# Patient Record
Sex: Male | Born: 2000 | Race: White | Hispanic: No | Marital: Single | Smoking: Never smoker
Health system: Southern US, Community
[De-identification: ages and names within clinical notes are randomized; demographics above are authoritative.]

## PROBLEM LIST (undated history)

## (undated) DIAGNOSIS — L309 Dermatitis, unspecified: Secondary | ICD-10-CM

## (undated) DIAGNOSIS — J45909 Unspecified asthma, uncomplicated: Secondary | ICD-10-CM

## (undated) HISTORY — DX: Dermatitis, unspecified: L30.9

## (undated) HISTORY — PX: NO PAST SURGERIES: SHX2092

## (undated) HISTORY — DX: Unspecified asthma, uncomplicated: J45.909

---
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
## 2022-04-06 ENCOUNTER — Encounter: Payer: Self-pay | Admitting: Allergy & Immunology

## 2022-04-06 ENCOUNTER — Ambulatory Visit (INDEPENDENT_AMBULATORY_CARE_PROVIDER_SITE_OTHER): Payer: Commercial Managed Care - PPO | Admitting: Allergy & Immunology

## 2022-04-06 VITALS — BP 104/68 | HR 76 | Temp 98.6°F | Resp 16 | Ht 72.0 in | Wt 144.0 lb

## 2022-04-06 DIAGNOSIS — T781XXA Other adverse food reactions, not elsewhere classified, initial encounter: Secondary | ICD-10-CM

## 2022-04-06 DIAGNOSIS — J4520 Mild intermittent asthma, uncomplicated: Secondary | ICD-10-CM

## 2022-04-06 DIAGNOSIS — J3089 Other allergic rhinitis: Secondary | ICD-10-CM

## 2022-04-06 DIAGNOSIS — L500 Allergic urticaria: Secondary | ICD-10-CM | POA: Diagnosis not present

## 2022-04-06 DIAGNOSIS — J302 Other seasonal allergic rhinitis: Secondary | ICD-10-CM

## 2022-04-06 DIAGNOSIS — T781XXD Other adverse food reactions, not elsewhere classified, subsequent encounter: Secondary | ICD-10-CM

## 2022-04-06 MED ORDER — EPINEPHRINE 0.3 MG/0.3ML IJ SOAJ: 0.3000 mg | Freq: Once | INTRAMUSCULAR | 2 refills | Status: AC

## 2022-04-06 NOTE — Patient Instructions (Addendum)
1. Mild intermittent asthma, uncomplicated - Lung testing looks great. - We are going to remove asthma from your diagnosis list since this is not a problem any longer.   2.  Perennial and seasonal allergic rhinitis  - Testing today showed: grasses, ragweed, weeds, trees, and dog. - Copy of test results provided.  - Avoidance measures provided. - Continue with: Alavert as needed - You can use an extra dose of the antihistamine, if needed, for breakthrough symptoms.  - Consider nasal saline rinses 1-2 times daily to remove allergens from the nasal cavities as well as help with mucous clearance (this is especially helpful to do before the nasal sprays are given) - Based on your symptoms today, I do not think we need to be more aggressive with regards to treatment. - We may consider in the future if needed. - I think we can hold off on allergy shots for now.  3. Adverse food reaction - Testing was having reacted to peanuts and slightly reactive to almond and hazelnut. - Copy of testing results provided. - I did order blood work to clarify which part of the peanut protein you are sensitized to, which would help [place] determine whether it safe to do a food challenge. - You can get these labs done if you are interested in doing that. - In the meantime, continue to avoid peanuts and tree nuts.  4. Return in about 1 year (around 04/07/2023).    Please inform us of any Emergency Department visits, hospitalizations, or changes in symptoms. Call us before going to the ED for breathing or allergy symptoms since we might be able to fit you in for a sick visit. Feel free to contact us anytime with any questions, problems, or concerns.  It was a pleasure to see you again today!  Websites that have reliable patient information: 1. American Academy of Asthma, Allergy, and Immunology: www.aaaai.org 2. Food Allergy Research and Education (FARE): foodallergy.org 3. Mothers of Asthmatics:  http://www.asthmacommunitynetwork.org 4. American College of Allergy, Asthma, and Immunology: www.acaai.org   COVID-19 Vaccine Information can be found at: [email or web address] For questions related to vaccine distribution or appointments, please email [email or web address] or call [phone].   We realize that you might be concerned about having an allergic reaction to the COVID19 vaccines. To help with that concern, WE ARE OFFERING THE COVID19 VACCINES IN OUR OFFICE! Ask the front desk for dates!     "Like" [place] on Facebook and Instagram for our latest updates!      A healthy democracy works best when [employer] participate! Make sure you are registered to vote! If you have moved or changed any of your contact information, you will need to get this updated before voting!  In some cases, you MAY be able to register to vote online: [email or web address]      Airborne Adult Perc - 04/06/22 0936     Time Antigen Placed 6213    Allergen Manufacturer Waynette Buttery    Location Back    Number of Test 59    1. Control-Buffer 50% Glycerol Negative    2. Control-Histamine 1 mg/ml 2+    3. Albumin saline Negative    4. Bahia 3+    5. [place] 4+    6. Johnson 3+    7. Kentucky Blue Negative    8. Meadow Fescue 4+    9. Perennial Rye 4+    10. Sweet Vernal 4+    11. Timothy 4+    12. Cocklebur Negative  13. Burweed Marshelder Negative    14. Ragweed, short Negative    15. Ragweed, Giant 2+    16. Plantain,  English 2+    17. Lamb's Quarters 2+    18. Sheep Sorrell 2+    19. Rough Pigweed 2+    20. Marsh Elder, Rough Negative    21. Mugwort, Common 4+    22. Ash mix Negative    23. Birch mix 3+    24. Beech American Negative    25. Box, Elder 3+    26. Cedar, red 2+    27. Cottonwood, Eastern 2+    28. Elm mix 2+    29. Hickory 3+    30. Maple mix Negative    31. Oak, Guinea-Bissau mix Negative     32. Pecan Pollen Negative    33. Pine mix Negative    34. Sycamore Eastern Negative    35. Walnut, Black Pollen Negative    36. Alternaria alternata Negative    37. Cladosporium Herbarum Negative    38. Aspergillus mix Negative    39. Penicillium mix Negative    40. Bipolaris sorokiniana (Helminthosporium) Negative    41. Drechslera spicifera (Curvularia) Negative    42. Mucor plumbeus Negative    43. Fusarium moniliforme Negative    44. Aureobasidium pullulans (pullulara) Negative    45. Rhizopus oryzae Negative    46. Botrytis cinera Negative    47. Epicoccum nigrum Negative    48. Phoma betae Negative    49. Candida Albicans Negative    50. Trichophyton mentagrophytes Negative    51. Mite, D Farinae  5,000 AU/ml Negative    52. Mite, D Pteronyssinus  5,000 AU/ml Negative    53. Cat Hair 10,000 BAU/ml Negative    54.  Dog Epithelia 2+    55. Mixed Feathers Negative    56. Horse Epithelia Negative    57. Cockroach, German Negative    58. Mouse Negative    59. Tobacco Leaf Negative             Food Adult Perc - 04/06/22 0900     Time Antigen Placed 9563    Allergen Manufacturer Waynette Buttery    Location Back    Number of allergen test 8    1. Peanut --   29x45   10. Cashew Negative    11. Pecan Food Negative    12. Walnut Food Negative    13. Almond --   5x14   14. Hazelnut --   5x29   15. [place] nut Negative    16. Coconut Negative    17. Pistachio Negative             Reducing Pollen Exposure  The American Academy of Allergy, Asthma and Immunology suggests the following steps to reduce your exposure to pollen during allergy seasons.    Do not hang sheets or clothing out to dry; pollen may collect on these items. Do not mow lawns or spend time around freshly cut grass; mowing stirs up pollen. Keep windows closed at night.  Keep car windows closed while driving. Minimize morning activities outdoors, a time when pollen counts are usually at their highest. Stay  indoors as much as possible when pollen counts or humidity is high and on windy days when pollen tends to remain in the air longer. Use air conditioning when possible.  Many air conditioners have filters that trap the pollen spores. Use a HEPA room air filter to remove pollen form  the indoor air you breathe.   Control of Dog or Cat Allergen  Avoidance is the best way to manage a dog or cat allergy. If you have a dog or cat and are allergic to dog or cats, consider removing the dog or cat from the home. If you have a dog or cat but don't want to find it a new home, or if your family wants a pet even though someone in the household is allergic, here are some strategies that may help keep symptoms at bay:  Keep the pet out of your bedroom and restrict it to only a few rooms. Be advised that keeping the dog or cat in only one room will not limit the allergens to that room. Don't pet, hug or kiss the dog or cat; if you do, wash your hands with soap and water. High-efficiency particulate air (HEPA) cleaners run continuously in a bedroom or living room can reduce allergen levels over time. Regular use of a high-efficiency vacuum cleaner or a central vacuum can reduce allergen levels. Giving your dog or cat a bath at least once a week can reduce airborne allergen.

## 2022-04-06 NOTE — Addendum Note (Signed)
Addended by: Alfonse Spruce on: 04/06/2022 10:02 AM   Modules accepted: Orders

## 2022-04-06 NOTE — Progress Notes (Signed)
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 Mild intermittent asthma, uncomplicated - Lung testing looks great. - We are going to remove asthma from your diagnosis list since this is not a problem any longer.   2.  Perennial and seasonal allergic rhinitis  - Testing today showed: grasses, ragweed, weeds, trees, and dog. - Copy of test results provided.  - Avoidance measures provided. - Continue with: Alavert as needed - You can use an extra dose of the antihistamine, if needed, for breakthrough symptoms.  - Consider nasal saline rinses 1-2 times daily to remove allergens from the nasal cavities as well as help with mucous clearance (this is especially helpful to do before the nasal sprays are given) - Based on your symptoms today, I do not think we need to be more aggressive with regards to treatment. - We may consider in the future if needed. - I think we can hold off on allergy shots for now.  3. Adverse food reaction - Testing was having reacted to peanuts and slightly reactive to almond and hazelnut. - Copy of testing results provided. - I did order blood work to clarify which part of the peanut protein you are sensitized to, which would help [place] determine whether it safe to do a food challenge. - You can get these labs done if you are interested in doing that. - In the meantime, continue to avoid peanuts and tree nuts.  4. Return in about 1 year (around 04/07/2023).   This note in its entirety was forwarded to the Provider who requested this consultation.  Subjective:   Kyle Harper is a 21 y.o. male presenting today for evaluation of  Chief Complaint  Patient presents  with   Asthma   Allergy Testing    Environmental: All except cat/dog Food: Peanuts, Tree Nuts    Kyle Harper has a history of the following: Patient Active Problem List   Diagnosis Date Noted   Mild intermittent asthma, uncomplicated 07/21/2016   Chronic seasonal allergic rhinitis due to pollen 07/21/2016   Adverse food reaction 07/21/2016    History obtained from: chart review and patient.  Kyle Harper was referred by Joya Salm.     Kyle Harper is a 21 y.o. male presenting for an evaluation of allergies and food allergies .  He was last seen in December 2017.  At that time, his lung testing looked good.  We continue with albuterol as needed.  For his allergic rhinitis, we continue with Allegra during the spring season.  We refilled his epinephrine autoinjector.  We recommended retesting at some point.  He has not followed up since that time.   Asthma/Respiratory Symptom History: He has intermittent asthma that is worse in the winter months.  He does not remember the last time that he used albuterol at all.   Allergic Rhinitis Symptom History: His last environmental allergy testing was in July 2014 and was positive to grasses, weeds, trees, and seleceted molds as well as dust mite and cat. He has dust mite covers on his pillows and bedding. He is on Alavert that he takes in the spring time and whatnot. He does not get sinus infections often at all. He does not get antibiotics regularly at  all. He does not remember the last time he got antibiotics.   Food Allergy Symptom History: He had testing performed in August 2014 by Dr. Mikle Bosworth that showed positives to peanuts (10x10), cashew (3x3), pecan (2x2), walnut (3x3), almond (3x3), and hazelnut (3x3).  He has not had any other accidental exposures. He had otherwise not eaten peanuts or tree nuts. He does need a new EpiPen. He does eat may contain products.    He has acne and is followed by Dr. Orvan Falconer. He takes doxycycline daily.  He has been on that daily for a number of years around three or so. He also takes a probiotic with that.   He has a degree in Biology. He is hoping to work with animals.  Otherwise, there is no history of other atopic diseases, including drug allergies, stinging insect allergies, eczema, urticaria, or contact dermatitis. There is no significant infectious history. Vaccinations are up to date.    Past Medical History: Patient Active Problem List   Diagnosis Date Noted   Mild intermittent asthma, uncomplicated 07/21/2016   Chronic seasonal allergic rhinitis due to pollen 07/21/2016   Adverse food reaction 07/21/2016    Medication List:  Allergies as of 04/06/2022       Reactions   Other Anaphylaxis   Tree Nut   Peanut-containing Drug Products Anaphylaxis        Medication List        Accurate as of April 06, 2022  9:55 AM. If you have any questions, ask your nurse or doctor.          STOP taking these medications    acidophilus Caps capsule Stopped by: Kyle Spruce, MD       TAKE these medications    ascorbic acid 500 MG tablet Commonly known as: VITAMIN C Take 500 mg by mouth daily.   cholecalciferol 1000 units tablet Commonly known as: VITAMIN D Take 1,000 Units by mouth daily.   doxycycline 100 MG capsule Commonly known as: VIBRAMYCIN Take 100 mg by mouth 2 (two) times daily.   Probiotic 250 MG Caps Take by mouth.   Ventolin HFA 108 (90 Base) MCG/ACT inhaler Generic drug: albuterol Inhale 2 puffs into the lungs every 6 (six) hours as needed for wheezing or shortness of breath.        Birth History: non-contributory  Developmental History: non-contributory  Past Surgical History: Past Surgical History:  Procedure Laterality Date   NO PAST SURGERIES       Family History: Family History  Problem Relation Age of Onset   Allergic rhinitis Neg Hx    Angioedema Neg Hx    Asthma Neg Hx    Eczema Neg Hx    Immunodeficiency Neg  Hx    Urticaria Neg Hx      Social History: Kyle Harper lives at home with his family in [place]. He had a pet rabbit who passed away a few months ago. They live in a house that is 23 years old. There is hardwood flooring throughout the home. There is a heat pump for heating and cooling. There are no indoor animals at this point in time. There are not dust mite coverings on the bed or pillows.     Review of Systems  Constitutional: Negative.  Negative for chills, fever, malaise/fatigue and weight loss.  HENT:  Negative for congestion, ear discharge, ear pain and sinus pain.   Eyes:  Negative for pain, discharge and redness.  Respiratory:  Negative for cough, sputum production,  shortness of breath and wheezing.   Cardiovascular: Negative.  Negative for chest pain and palpitations.  Gastrointestinal:  Negative for abdominal pain, constipation, diarrhea, heartburn, nausea and vomiting.  Skin: Negative.  Negative for itching and rash.  Neurological:  Negative for dizziness and headaches.  Endo/Heme/Allergies:  Negative for environmental allergies. Does not bruise/bleed easily.       Objective:   Blood pressure 104/68, pulse 76, temperature 98.6 F (37 C), resp. rate 16, height 6' (1.829 m), weight 144 lb (65.3 kg), SpO2 97 %. Body mass index is 19.53 kg/m.     Physical Exam Vitals reviewed.  Constitutional:      Appearance: He is well-developed.     Comments: Somewhat quiet.  Cooperative overall.  HENT:     Head: Normocephalic and atraumatic.     Right Ear: Tympanic membrane, ear canal and external ear normal. No drainage, swelling or tenderness. Tympanic membrane is not injected, scarred, erythematous, retracted or bulging.     Left Ear: Tympanic membrane, ear canal and external ear normal. No drainage, swelling or tenderness. Tympanic membrane is not injected, scarred, erythematous, retracted or bulging.     Nose: No nasal deformity, septal deviation, mucosal edema or rhinorrhea.      Right Turbinates: Enlarged, swollen and pale.     Left Turbinates: Enlarged, swollen and pale.     Right Sinus: No maxillary sinus tenderness or frontal sinus tenderness.     Left Sinus: No maxillary sinus tenderness or frontal sinus tenderness.     Mouth/Throat:     Mouth: Mucous membranes are not pale and not dry.     Pharynx: Uvula midline.  Eyes:     General:        Right eye: No discharge.        Left eye: No discharge.     Conjunctiva/sclera: Conjunctivae normal.     Right eye: Right conjunctiva is not injected. No chemosis.    Left eye: Left conjunctiva is not injected. No chemosis.    Pupils: Pupils are equal, round, and reactive to light.  Cardiovascular:     Rate and Rhythm: Normal rate and regular rhythm.     Heart sounds: Normal heart sounds.  Pulmonary:     Effort: Pulmonary effort is normal. No tachypnea, accessory muscle usage or respiratory distress.     Breath sounds: Normal breath sounds. No wheezing, rhonchi or rales.     Comments: Moving air well in all lung fields. No increased work of breathing noted.   Chest:     Chest wall: No tenderness.  Abdominal:     Tenderness: There is no abdominal tenderness. There is no guarding or rebound.  Lymphadenopathy:     Head:     Right side of head: No submandibular, tonsillar or occipital adenopathy.     Left side of head: No submandibular, tonsillar or occipital adenopathy.     Cervical: No cervical adenopathy.  Skin:    Coloration: Skin is not pale.     Findings: No abrasion, erythema, petechiae or rash. Rash is not papular, urticarial or vesicular.  Neurological:     Mental Status: He is alert.  Psychiatric:        Behavior: Behavior is cooperative.      Diagnostic studies:   Spirometry: results normal (FEV1: 4.31/87%, FVC: 5.78/97%, FEV1/FVC: 75%).    Spirometry consistent with normal pattern.   Allergy Studies:     Airborne Adult Perc - 04/06/22 0936     Time Antigen Placed  9242    Allergen  Manufacturer Waynette Buttery    Location Back    Number of Test 59    1. Control-Buffer 50% Glycerol Negative    2. Control-Histamine 1 mg/ml 2+    3. Albumin saline Negative    4. Bahia 3+    5. [place] 4+    6. [place] 3+    7. [place] Blue Negative    8. Meadow Fescue 4+    9. Perennial Rye 4+    10. Sweet Vernal 4+    11. Timothy 4+    12. Cocklebur Negative    13. Burweed Marshelder Negative    14. Ragweed, short Negative    15. Ragweed, Giant 2+    16. Plantain,  English 2+    17. Lamb's Quarters 2+    18. Sheep Sorrell 2+    19. Rough Pigweed 2+    20. Marsh Elder, Rough Negative    21. Mugwort, Common 4+    22. Ash mix Negative    23. Birch mix 3+    24. Beech American Negative    25. Box, Elder 3+    26. Cedar, red 2+    27. Cottonwood, Eastern 2+    28. Elm mix 2+    29. Hickory 3+    30. Maple mix Negative    31. Oak, Guinea-[place] mix Negative    32. Pecan Pollen Negative    33. Pine mix Negative    34. Sycamore Eastern Negative    35. Walnut, Black Pollen Negative    36. Alternaria alternata Negative    37. Cladosporium Herbarum Negative    38. Aspergillus mix Negative    39. Penicillium mix Negative    40. Bipolaris sorokiniana (Helminthosporium) Negative    41. Drechslera spicifera (Curvularia) Negative    42. Mucor plumbeus Negative    43. Fusarium moniliforme Negative    44. Aureobasidium pullulans (pullulara) Negative    45. Rhizopus oryzae Negative    46. Botrytis cinera Negative    47. Epicoccum nigrum Negative    48. Phoma betae Negative    49. Candida Albicans Negative    50. Trichophyton mentagrophytes Negative    51. Mite, D Farinae  5,000 AU/ml Negative    52. Mite, D Pteronyssinus  5,000 AU/ml Negative    53. Cat Hair 10,000 BAU/ml Negative    54.  Dog Epithelia 2+    55. Mixed Feathers Negative    56. Horse Epithelia Negative    57. Cockroach, German Negative    58. Mouse Negative    59. Tobacco Leaf Negative             Food Adult Perc  - 04/06/22 0900     Time Antigen Placed 6834    Allergen Manufacturer Waynette Buttery    Location Back    Number of allergen test 8    1. Peanut --   29x45   10. Cashew Negative    11. Pecan Food Negative    12. Walnut Food Negative    13. Almond --   5x14   14. Hazelnut --   5x29   15. [place] nut Negative    16. Coconut Negative    17. Pistachio Negative             Allergy testing results were read and interpreted by myself, documented by clinical staff.         Malachi Bonds, MD [facility]

## 2022-04-07 ENCOUNTER — Other Ambulatory Visit: Payer: Self-pay | Admitting: *Deleted

## 2022-04-07 MED ORDER — EPINEPHRINE 0.3 MG/0.3ML IJ SOAJ: 0.3000 mg | INTRAMUSCULAR | 1 refills | Status: AC | PRN

## 2022-06-14 ENCOUNTER — Telehealth: Payer: Self-pay | Admitting: Allergy & Immunology

## 2022-06-14 NOTE — Telephone Encounter (Signed)
I received labs from [facility]. It was not the test that I ordered at all. One was a "nut panel", which did not give specific numbers for any particular tree nut. The overall level was low, however. I also received a food panel that was very low/essentially negative to cabbage, spinach, tomato, and paprika. Again, I did not order these. It seems that whatever was ordered at [facility] did NOT fit with what I ordered.  Rohn can either have them redrawn at [facility] so that the correct test is sent or we can wait to discuss at his next visit.  Malachi Bonds, MD [facility]

## 2022-06-14 NOTE — Telephone Encounter (Signed)
Called and spoke with patient and advised of lab results and to have labs redrawn or discuss at next office visit. Patient verbalized understanding.
